# Patient Record
Sex: Male | Born: 1955 | Race: White | Hispanic: No | Marital: Single | Smoking: Former smoker
Health system: Southern US, Community
[De-identification: ages and names within clinical notes are randomized; demographics above are authoritative.]

## PROBLEM LIST (undated history)

## (undated) DIAGNOSIS — R011 Cardiac murmur, unspecified: Secondary | ICD-10-CM

## (undated) DIAGNOSIS — M549 Dorsalgia, unspecified: Secondary | ICD-10-CM

## (undated) HISTORY — PX: MULTIPLE TOOTH EXTRACTIONS: SHX2053

---

## 1998-01-11 ENCOUNTER — Emergency Department (HOSPITAL_COMMUNITY): Admission: EM | Admit: 1998-01-11 | Discharge: 1998-01-12 | Payer: Self-pay | Admitting: Emergency Medicine

## 1998-01-12 ENCOUNTER — Ambulatory Visit (HOSPITAL_COMMUNITY): Admission: RE | Admit: 1998-01-12 | Discharge: 1998-01-12 | Payer: Self-pay | Admitting: Emergency Medicine

## 1998-01-16 ENCOUNTER — Ambulatory Visit (HOSPITAL_COMMUNITY): Admission: RE | Admit: 1998-01-16 | Discharge: 1998-01-16 | Payer: Self-pay | Admitting: Urology

## 1998-02-10 ENCOUNTER — Ambulatory Visit (HOSPITAL_COMMUNITY): Admission: RE | Admit: 1998-02-10 | Discharge: 1998-02-10 | Payer: Self-pay | Admitting: Urology

## 1999-10-30 ENCOUNTER — Emergency Department (HOSPITAL_COMMUNITY): Admission: EM | Admit: 1999-10-30 | Discharge: 1999-10-30 | Payer: Self-pay | Admitting: Emergency Medicine

## 2002-07-01 ENCOUNTER — Emergency Department (HOSPITAL_COMMUNITY): Admission: EM | Admit: 2002-07-01 | Discharge: 2002-07-01 | Payer: Self-pay | Admitting: *Deleted

## 2002-07-01 ENCOUNTER — Encounter: Payer: Self-pay | Admitting: Emergency Medicine

## 2006-05-14 ENCOUNTER — Emergency Department: Payer: Self-pay | Admitting: Emergency Medicine

## 2006-08-16 ENCOUNTER — Encounter: Payer: Self-pay | Admitting: Family Medicine

## 2006-08-24 ENCOUNTER — Ambulatory Visit: Payer: Self-pay

## 2006-09-07 ENCOUNTER — Ambulatory Visit: Payer: Self-pay

## 2006-09-10 ENCOUNTER — Encounter: Payer: Self-pay | Admitting: Family Medicine

## 2007-10-19 ENCOUNTER — Other Ambulatory Visit: Payer: Self-pay

## 2007-10-20 ENCOUNTER — Inpatient Hospital Stay: Payer: Self-pay | Admitting: Internal Medicine

## 2009-01-20 ENCOUNTER — Ambulatory Visit: Payer: Self-pay | Admitting: Family Medicine

## 2010-08-10 ENCOUNTER — Ambulatory Visit: Payer: Self-pay | Admitting: Family Medicine

## 2011-04-19 ENCOUNTER — Ambulatory Visit: Payer: Self-pay | Admitting: Family Medicine

## 2011-06-01 ENCOUNTER — Emergency Department: Payer: Self-pay | Admitting: Emergency Medicine

## 2011-06-03 ENCOUNTER — Emergency Department: Payer: Self-pay | Admitting: Emergency Medicine

## 2011-06-09 ENCOUNTER — Emergency Department: Payer: Self-pay | Admitting: Emergency Medicine

## 2014-03-02 ENCOUNTER — Observation Stay (HOSPITAL_COMMUNITY)
Admission: EM | Admit: 2014-03-02 | Discharge: 2014-03-03 | Disposition: A | Discharge status: Home / Self Care | Payer: Self-pay | Attending: General Surgery | Admitting: General Surgery

## 2014-03-02 ENCOUNTER — Encounter (HOSPITAL_COMMUNITY): Payer: Self-pay | Admitting: Emergency Medicine

## 2014-03-02 ENCOUNTER — Emergency Department (HOSPITAL_COMMUNITY): Payer: Self-pay

## 2014-03-02 DIAGNOSIS — S069X0A Unspecified intracranial injury without loss of consciousness, initial encounter: Principal | ICD-10-CM | POA: Insufficient documentation

## 2014-03-02 DIAGNOSIS — S0990XA Unspecified injury of head, initial encounter: Secondary | ICD-10-CM

## 2014-03-02 DIAGNOSIS — S2220XA Unspecified fracture of sternum, initial encounter for closed fracture: Secondary | ICD-10-CM

## 2014-03-02 DIAGNOSIS — S2232XA Fracture of one rib, left side, initial encounter for closed fracture: Secondary | ICD-10-CM

## 2014-03-02 DIAGNOSIS — S065X9A Traumatic subdural hemorrhage with loss of consciousness of unspecified duration, initial encounter: Secondary | ICD-10-CM

## 2014-03-02 DIAGNOSIS — R1032 Left lower quadrant pain: Secondary | ICD-10-CM | POA: Insufficient documentation

## 2014-03-02 DIAGNOSIS — R918 Other nonspecific abnormal finding of lung field: Secondary | ICD-10-CM | POA: Insufficient documentation

## 2014-03-02 DIAGNOSIS — J438 Other emphysema: Secondary | ICD-10-CM | POA: Insufficient documentation

## 2014-03-02 DIAGNOSIS — IMO0002 Reserved for concepts with insufficient information to code with codable children: Secondary | ICD-10-CM | POA: Insufficient documentation

## 2014-03-02 DIAGNOSIS — S2239XA Fracture of one rib, unspecified side, initial encounter for closed fracture: Secondary | ICD-10-CM | POA: Insufficient documentation

## 2014-03-02 DIAGNOSIS — S065XAA Traumatic subdural hemorrhage with loss of consciousness status unknown, initial encounter: Secondary | ICD-10-CM

## 2014-03-02 DIAGNOSIS — S80811A Abrasion, right lower leg, initial encounter: Secondary | ICD-10-CM | POA: Diagnosis present

## 2014-03-02 DIAGNOSIS — M5030 Other cervical disc degeneration, unspecified cervical region: Secondary | ICD-10-CM | POA: Insufficient documentation

## 2014-03-02 DIAGNOSIS — Y9389 Activity, other specified: Secondary | ICD-10-CM | POA: Insufficient documentation

## 2014-03-02 DIAGNOSIS — R072 Precordial pain: Secondary | ICD-10-CM | POA: Insufficient documentation

## 2014-03-02 DIAGNOSIS — Y998 Other external cause status: Secondary | ICD-10-CM | POA: Insufficient documentation

## 2014-03-02 DIAGNOSIS — Y92410 Unspecified street and highway as the place of occurrence of the external cause: Secondary | ICD-10-CM | POA: Insufficient documentation

## 2014-03-02 DIAGNOSIS — S27329A Contusion of lung, unspecified, initial encounter: Secondary | ICD-10-CM

## 2014-03-02 HISTORY — DX: Dorsalgia, unspecified: M54.9

## 2014-03-02 HISTORY — DX: Cardiac murmur, unspecified: R01.1

## 2014-03-02 LAB — CBC
HCT: 43.1 % (ref 39.0–52.0)
HEMOGLOBIN: 14.4 g/dL (ref 13.0–17.0)
MCH: 31.2 pg (ref 26.0–34.0)
MCHC: 33.4 g/dL (ref 30.0–36.0)
MCV: 93.3 fL (ref 78.0–100.0)
Platelets: 201 10*3/uL (ref 150–400)
RBC: 4.62 MIL/uL (ref 4.22–5.81)
RDW: 13 % (ref 11.5–15.5)
WBC: 9.9 10*3/uL (ref 4.0–10.5)

## 2014-03-02 LAB — COMPREHENSIVE METABOLIC PANEL
ALBUMIN: 4 g/dL (ref 3.5–5.2)
ALT: 38 U/L (ref 0–53)
ANION GAP: 13 (ref 5–15)
AST: 31 U/L (ref 0–37)
Alkaline Phosphatase: 86 U/L (ref 39–117)
BUN: 11 mg/dL (ref 6–23)
CALCIUM: 9.5 mg/dL (ref 8.4–10.5)
CHLORIDE: 102 meq/L (ref 96–112)
CO2: 25 mEq/L (ref 19–32)
CREATININE: 0.73 mg/dL (ref 0.50–1.35)
GFR calc Af Amer: >90 mL/min (ref >90)
GFR calc non Af Amer: >90 mL/min (ref >90)
Glucose, Bld: 90 mg/dL (ref 70–99)
Potassium: 4.7 mEq/L (ref 3.7–5.3)
Sodium: 140 mEq/L (ref 137–147)
TOTAL PROTEIN: 7.7 g/dL (ref 6.0–8.3)
Total Bilirubin: 0.6 mg/dL (ref 0.3–1.2)

## 2014-03-02 LAB — I-STAT TROPONIN, ED: Troponin i, poc: 0 ng/mL (ref 0.00–0.08)

## 2014-03-02 LAB — CDS SEROLOGY

## 2014-03-02 LAB — PROTIME-INR
INR: 1 (ref 0.00–1.49)
PROTHROMBIN TIME: 13.2 s (ref 11.6–15.2)

## 2014-03-02 LAB — SAMPLE TO BLOOD BANK

## 2014-03-02 LAB — ETHANOL: Alcohol, Ethyl (B): <11 mg/dL (ref 0–11)

## 2014-03-02 MED ORDER — FENTANYL CITRATE 0.05 MG/ML IJ SOLN
100.0000 ug | Freq: Once | INTRAMUSCULAR | Status: AC
  Administered 2014-03-02: 100 ug via INTRAVENOUS
  Filled 2014-03-02: qty 2

## 2014-03-02 MED ORDER — SODIUM CHLORIDE 0.9 % IV BOLUS (SEPSIS)
1000.0000 mL | Freq: Once | INTRAVENOUS | Status: AC
  Administered 2014-03-02: 1000 mL via INTRAVENOUS

## 2014-03-02 MED ORDER — IOHEXOL 300 MG/ML  SOLN
100.0000 mL | Freq: Once | INTRAMUSCULAR | Status: AC | PRN
  Administered 2014-03-02: 100 mL via INTRAVENOUS

## 2014-03-02 MED ORDER — ACETAMINOPHEN 325 MG PO TABS: 650.0000 mg | ORAL_TABLET | ORAL | Status: DC | PRN

## 2014-03-02 MED ORDER — POTASSIUM CHLORIDE IN NACL 20-0.9 MEQ/L-% IV SOLN
INTRAVENOUS | Status: DC
  Administered 2014-03-02: 23:00:00 via INTRAVENOUS
  Filled 2014-03-02 (×2): qty 1000

## 2014-03-02 MED ORDER — OXYCODONE HCL 5 MG PO TABS
10.0000 mg | ORAL_TABLET | ORAL | Status: DC | PRN
  Administered 2014-03-03 (×2): 10 mg via ORAL
  Filled 2014-03-02 (×2): qty 2

## 2014-03-02 MED ORDER — ONDANSETRON HCL 4 MG/2ML IJ SOLN: 4.0000 mg | Freq: Four times a day (QID) | INTRAMUSCULAR | Status: DC | PRN

## 2014-03-02 MED ORDER — ONDANSETRON HCL 4 MG/2ML IJ SOLN
4.0000 mg | Freq: Once | INTRAMUSCULAR | Status: AC
  Administered 2014-03-02: 4 mg via INTRAVENOUS
  Filled 2014-03-02: qty 2

## 2014-03-02 MED ORDER — PANTOPRAZOLE SODIUM 40 MG PO TBEC
40.0000 mg | DELAYED_RELEASE_TABLET | Freq: Every day | ORAL | Status: DC
  Administered 2014-03-02 – 2014-03-03 (×2): 40 mg via ORAL
  Filled 2014-03-02 (×3): qty 1

## 2014-03-02 MED ORDER — PANTOPRAZOLE SODIUM 40 MG IV SOLR
40.0000 mg | Freq: Every day | INTRAVENOUS | Status: DC
  Filled 2014-03-02 (×2): qty 40

## 2014-03-02 MED ORDER — METHOCARBAMOL 1000 MG/10ML IJ SOLN: 500.0000 mg | Freq: Three times a day (TID) | INTRAVENOUS | Status: DC | PRN

## 2014-03-02 MED ORDER — OXYCODONE HCL 5 MG PO TABS
5.0000 mg | ORAL_TABLET | ORAL | Status: DC | PRN
  Administered 2014-03-02 (×2): 5 mg via ORAL
  Filled 2014-03-02 (×2): qty 1

## 2014-03-02 MED ORDER — DOCUSATE SODIUM 100 MG PO CAPS
100.0000 mg | ORAL_CAPSULE | Freq: Two times a day (BID) | ORAL | Status: DC
  Administered 2014-03-03: 100 mg via ORAL
  Filled 2014-03-02 (×2): qty 1

## 2014-03-02 MED ORDER — MORPHINE SULFATE 2 MG/ML IJ SOLN
1.0000 mg | INTRAMUSCULAR | Status: DC | PRN
  Administered 2014-03-02 (×2): 4 mg via INTRAVENOUS
  Administered 2014-03-03: 2 mg via INTRAVENOUS
  Administered 2014-03-03 (×3): 4 mg via INTRAVENOUS
  Filled 2014-03-02 (×4): qty 2
  Filled 2014-03-02: qty 1
  Filled 2014-03-02: qty 2

## 2014-03-02 MED ORDER — ONDANSETRON HCL 4 MG PO TABS: 4.0000 mg | ORAL_TABLET | Freq: Four times a day (QID) | ORAL | Status: DC | PRN
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
## 2014-03-02 NOTE — ED Notes (Signed)
Attempted report 

## 2014-03-03 ENCOUNTER — Observation Stay (HOSPITAL_COMMUNITY): Payer: Self-pay

## 2014-03-03 DIAGNOSIS — S2220XA Unspecified fracture of sternum, initial encounter for closed fracture: Secondary | ICD-10-CM | POA: Diagnosis present

## 2014-03-03 DIAGNOSIS — S27329A Contusion of lung, unspecified, initial encounter: Secondary | ICD-10-CM | POA: Diagnosis present

## 2014-03-03 DIAGNOSIS — S80811A Abrasion, right lower leg, initial encounter: Secondary | ICD-10-CM | POA: Diagnosis present

## 2014-03-03 DIAGNOSIS — S2232XA Fracture of one rib, left side, initial encounter for closed fracture: Secondary | ICD-10-CM | POA: Diagnosis present

## 2014-03-03 DIAGNOSIS — S0990XA Unspecified injury of head, initial encounter: Secondary | ICD-10-CM | POA: Diagnosis present

## 2014-03-03 LAB — CBC
HCT: 41.9 % (ref 39.0–52.0)
Hemoglobin: 13.6 g/dL (ref 13.0–17.0)
MCH: 30.6 pg (ref 26.0–34.0)
MCHC: 32.5 g/dL (ref 30.0–36.0)
MCV: 94.2 fL (ref 78.0–100.0)
PLATELETS: 211 10*3/uL (ref 150–400)
RBC: 4.45 MIL/uL (ref 4.22–5.81)
RDW: 13 % (ref 11.5–15.5)
WBC: 8.4 10*3/uL (ref 4.0–10.5)

## 2014-03-03 LAB — BASIC METABOLIC PANEL
ANION GAP: 10 (ref 5–15)
BUN: 8 mg/dL (ref 6–23)
CHLORIDE: 101 meq/L (ref 96–112)
CO2: 27 mEq/L (ref 19–32)
Calcium: 8.9 mg/dL (ref 8.4–10.5)
Creatinine, Ser: 0.89 mg/dL (ref 0.50–1.35)
GFR calc Af Amer: >90 mL/min (ref >90)
Glucose, Bld: 87 mg/dL (ref 70–99)
POTASSIUM: 4.2 meq/L (ref 3.7–5.3)
SODIUM: 138 meq/L (ref 137–147)

## 2014-03-03 LAB — MRSA PCR SCREENING: MRSA by PCR: NEGATIVE
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
## 2014-03-03 NOTE — Progress Notes (Signed)
Patient transferred from ER via stretcher on tele by ER RN. Patient ambulated independently from stretcher to bed, steady gait. Patient oriented to unit and room, instructed on callbell and placed at side. No family at bedside. Will continue to monitor.

## 2014-03-03 NOTE — Progress Notes (Signed)
Discharge instructions provided to patient.  Pt vu without coaching.  Prescription given for percocet.  When to call md, heart healthy diet, and increase activity slowly.  Discharge home via girlfriend. Cheryln Manly

## 2014-03-03 NOTE — Progress Notes (Signed)
Utilization Review Completed.   Kaiea Esselman, RN, BSN Nurse Case Manager  
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
## 2014-03-08 ENCOUNTER — Telehealth (HOSPITAL_COMMUNITY): Payer: Self-pay

## 2014-03-08 MED ORDER — OXYCODONE-ACETAMINOPHEN 10-325 MG PO TABS: 0.5000 | ORAL_TABLET | ORAL | Status: AC | PRN

## 2014-03-08 NOTE — Telephone Encounter (Signed)
Daniel Serrano called requesting refill on Percocet. Despite not being documented in d/c summary I see note where nurse gave rx so must have been afterthought or something. He went home with 5/325 #50. I will rx 10/325 #20 so that he can stretch it out a bit further.

## 2019-09-13 DIAGNOSIS — E782 Mixed hyperlipidemia: Secondary | ICD-10-CM | POA: Diagnosis not present

## 2019-09-13 DIAGNOSIS — Z87891 Personal history of nicotine dependence: Secondary | ICD-10-CM | POA: Diagnosis not present

## 2019-09-13 DIAGNOSIS — G4700 Insomnia, unspecified: Secondary | ICD-10-CM | POA: Diagnosis not present

## 2019-09-13 DIAGNOSIS — M5136 Other intervertebral disc degeneration, lumbar region: Secondary | ICD-10-CM | POA: Diagnosis not present

## 2019-09-13 DIAGNOSIS — M545 Low back pain: Secondary | ICD-10-CM | POA: Diagnosis not present

## 2019-10-04 ENCOUNTER — Encounter (INDEPENDENT_AMBULATORY_CARE_PROVIDER_SITE_OTHER): Payer: Self-pay | Admitting: Gastroenterology

## 2019-10-04 DIAGNOSIS — G4700 Insomnia, unspecified: Secondary | ICD-10-CM | POA: Diagnosis not present

## 2019-10-04 DIAGNOSIS — M545 Low back pain: Secondary | ICD-10-CM | POA: Diagnosis not present

## 2019-10-04 DIAGNOSIS — Z87891 Personal history of nicotine dependence: Secondary | ICD-10-CM | POA: Diagnosis not present

## 2019-12-21 DIAGNOSIS — M5136 Other intervertebral disc degeneration, lumbar region: Secondary | ICD-10-CM | POA: Diagnosis not present

## 2019-12-21 DIAGNOSIS — R7989 Other specified abnormal findings of blood chemistry: Secondary | ICD-10-CM | POA: Diagnosis not present

## 2019-12-21 DIAGNOSIS — G4700 Insomnia, unspecified: Secondary | ICD-10-CM | POA: Diagnosis not present

## 2019-12-21 DIAGNOSIS — Z87891 Personal history of nicotine dependence: Secondary | ICD-10-CM | POA: Diagnosis not present

## 2019-12-21 DIAGNOSIS — M545 Low back pain: Secondary | ICD-10-CM | POA: Diagnosis not present

## 2019-12-27 DIAGNOSIS — Z122 Encounter for screening for malignant neoplasm of respiratory organs: Secondary | ICD-10-CM | POA: Diagnosis not present

## 2019-12-27 DIAGNOSIS — Z87891 Personal history of nicotine dependence: Secondary | ICD-10-CM | POA: Diagnosis not present

## 2020-01-16 ENCOUNTER — Ambulatory Visit (INDEPENDENT_AMBULATORY_CARE_PROVIDER_SITE_OTHER): Payer: Medicare Other | Admitting: Gastroenterology

## 2020-01-16 ENCOUNTER — Other Ambulatory Visit: Payer: Self-pay

## 2020-01-16 ENCOUNTER — Encounter (INDEPENDENT_AMBULATORY_CARE_PROVIDER_SITE_OTHER): Payer: Self-pay | Admitting: Gastroenterology

## 2020-01-16 VITALS — BP 154/95 | HR 89 | Temp 98.5°F | Ht 73.0 in | Wt 197.7 lb

## 2020-01-16 DIAGNOSIS — Z1212 Encounter for screening for malignant neoplasm of rectum: Secondary | ICD-10-CM | POA: Diagnosis not present

## 2020-01-16 DIAGNOSIS — B182 Chronic viral hepatitis C: Secondary | ICD-10-CM

## 2020-01-16 DIAGNOSIS — Z1211 Encounter for screening for malignant neoplasm of colon: Secondary | ICD-10-CM

## 2020-01-16 DIAGNOSIS — B1920 Unspecified viral hepatitis C without hepatic coma: Secondary | ICD-10-CM | POA: Insufficient documentation

## 2020-01-16 NOTE — Patient Instructions (Signed)
Perform blood tests before starting treatment for hepatitis C Perform Cologuard testing

## 2020-01-16 NOTE — Progress Notes (Signed)
[name], M.D. Gastroenterology & Hepatology [facility] [address] [place], [place] [address]  [place] [address]  Referring MD: Judd Lien, MD  I will communicate my assessment and recommendations to the referring MD via EMR. "Note: Occasional unusual wording and randomly placed punctuation marks may result from the use of speech recognition technology to transcribe this document"  Chief Complaint: Hepatitis C  History of Present Illness: Daniel Serrano is a 64 y.o. male who presents for evaluation of hepatitis C.  Patient states that he recently underwent blood work-up as part of his physical examination and was found to have hepatitis C, for which she was referred to our clinic for further management.  The patient brings blood work-up from 09/13/2019 which showed hepatitis C RNA of 4,510,000 copies, genotype 2B, negative testing for HIV, CMP with creatinine 1.01, sodium 143, albumin 4.4, total bilirubin 0.4, alkaline phosphatase 98, AST 46, ALT 91, cell count 9.1, hemoglobin 14.8, platelets 259, TSH 1.8, acute hepatitis panel positive for hepatitis C virus antibody, negative hepatitis B surface antigen, negative hepatitis B core antibody IgM and negative hepatitis A antibody IgM.  Patient states he has been asymptomatic he feels fine.  The patient denies having any nausea, vomiting, fever, chills, hematochezia, melena, hematemesis, abdominal distention, abdominal pain, diarrhea, jaundice, pruritus or weight changes.  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 Patient had a couple tattoos performed by himself, did not share tattoos with anyone else. Never had transfusions before.  Last Colonoscopy: never but he is not interested in pursuing this procedure at this moment.  FHx: neg for any gastrointestinal/liver disease, no malignancies Social: quit smoking 10 years ago,  neg regular alcohol. Former drug use as above.  Past Medical History: Past Medical History:  Diagnosis Date  . Back pain   . Back pain   . Heart murmur     Past Surgical History: Past Surgical History:  Procedure Laterality Date  . MULTIPLE TOOTH EXTRACTIONS Bilateral     Family History: Family History  Problem Relation Age of Onset  . Rheum arthritis Mother   . Heart disease Mother   . Hypertension Mother     Social History: Social History   Tobacco Use  Smoking Status Former Smoker  . Years: 30.00  . Types: Cigarettes  . Quit date: 07/12/2010  . Years since quitting: 9.5  Smokeless Tobacco Never Used   Social History   Substance and Sexual Activity  Alcohol Use Yes   Comment: occassionally  once every other night 1/2 pint gin   Social History   Substance and Sexual Activity  Drug Use No    Allergies: No Known Allergies  Medications: Current Outpatient Medications  Medication Sig Dispense Refill  . loratadine (CLARITIN) 10 MG tablet Take 10 mg by mouth daily. As needed    . naproxen sodium (ANAPROX) 220 MG tablet Take 220 mg by mouth daily as needed (for pain).    Marland Kitchen acetaminophen (TYLENOL) 325 MG tablet Take 650 mg by mouth every 6 (six) hours as needed for moderate pain. (Patient not taking: Reported on 01/16/2020)     No current facility-administered medications for this visit.    Review of Systems: GENERAL: negative for malaise, significant weight loss, night sweats and fever HEENT: No changes in hearing or vision, no nose bleeds or other nasal problems. No trouble swallowing NECK: Negative for lumps, goiter, pain and  significant neck swelling RESPIRATORY: Negative for cough, wheezing and shortness of breath CARDIOVASCULAR: Negative for chest pain, leg swelling, palpitations, orthopnea GI: SEE HPI MUSCULOSKELETAL: Negative for joint pain or swelling, back pain, and muscle pain. SKIN: Negative for lesions, rash, and itching. PSYCH: Negative for  sleep disturbance, mood disorder and recent psychosocial stressors. HEMATOLOGY Negative for prolonged bleeding, bruising easily, and swollen nodes. ENDOCRINE: Negative for cold or heat intolerance, polyuria, polydipsia and goiter. NEURO: negative for lightheadedness, dizziness, tremor, gait imbalance, syncope and seizures. The remainder of the review of systems is noncontributory.   Physical Exam: BP (!) 154/95 (BP Location: Right Arm, Patient Position: Sitting, Cuff Size: Normal)   Pulse 89   Temp 98.5 F (36.9 C) (Oral)   Ht 6\' 1"  (1.854 m)   Wt 197 lb 11.2 oz (89.7 kg)   BMI 26.08 kg/m  GENERAL: The patient is AO x3, in no acute distress. HEENT: Head is normocephalic and atraumatic. EOMI are intact. Mouth is well hydrated and without lesions. NECK: Supple. No masses LUNGS: Clear to auscultation. No presence of rhonchi/wheezing/rales. Adequate chest expansion HEART: RRR, normal s1 and s2. ABDOMEN: Soft, nontender, no guarding, no peritoneal signs, and nondistended. BS +. No masses. EXTREMITIES: Without any cyanosis, clubbing, rash, lesions or edema. NEUROLOGIC: AOx3, no focal motor deficit. SKIN: no jaundice, no rashes   Imaging/Labs: as above  I personally reviewed and interpreted the available labs, imaging and endoscopic files.  Impression and Plan: Daniel Serrano is a 64 y.o. male who presents for evaluation of hepatitis C. the patient is a healthy person otherwise but was found to have hepatitis C by screening testing.  He had a risk factor in the past given the fact he used cocaine intranasally.  He has evidence of ongoing inflammation based on most recent aminotransferases but no presence of alteration in his other liver function tests or synthetic function tests that we will concern about cirrhosis, we will need to check for his platelet count nevertheless.  Before starting him on the available medications to treat his hepatitis C (either Epclusa or Mavyret) we will need  to check his hepatitis B core antibody IgG status as a latent infection will warrant a closer follow-up once anti-hepatitis C treatment is started.  Finally, discussion was held with the patient regarding colorectal cancer screening with colonoscopy, which he is not interested to pursue at this moment.  Decision was made with the patient to proceed with Cologuard testing, he understood that if the test came back positive he will need to undergo a colonoscopy which we will consider performing at that time.  He understands that there may be some false positives to this test.  - Check CBC, CMP, Hep B c Ab - Will order Mavyret or Epclusa once lab results are back - Perform Cologuard testing - RTC 3 months  All questions were answered.      Harvel Quale, MD

## 2020-02-01 LAB — COMPREHENSIVE METABOLIC PANEL
AG Ratio: 1.1 (calc) (ref 1.0–2.5)
ALT: 54 U/L — ABNORMAL HIGH (ref 9–46)
AST: 35 U/L (ref 10–35)
Albumin: 4.6 g/dL (ref 3.6–5.1)
Alkaline phosphatase (APISO): 96 U/L (ref 35–144)
BUN: 21 mg/dL (ref 7–25)
CO2: 29 mmol/L (ref 20–32)
Calcium: 9.8 mg/dL (ref 8.6–10.3)
Chloride: 102 mmol/L (ref 98–110)
Creat: 0.91 mg/dL (ref 0.70–1.25)
Globulin: 4.1 g/dL (calc) — ABNORMAL HIGH (ref 1.9–3.7)
Glucose, Bld: 86 mg/dL (ref 65–139)
Potassium: 4.4 mmol/L (ref 3.5–5.3)
Sodium: 138 mmol/L (ref 135–146)
Total Bilirubin: 0.5 mg/dL (ref 0.2–1.2)
Total Protein: 8.7 g/dL — ABNORMAL HIGH (ref 6.1–8.1)

## 2020-02-01 LAB — CBC
HCT: 48.1 % (ref 38.5–50.0)
Hemoglobin: 16.8 g/dL (ref 13.2–17.1)
MCH: 30.9 pg (ref 27.0–33.0)
MCHC: 34.9 g/dL (ref 32.0–36.0)
MCV: 88.4 fL (ref 80.0–100.0)
MPV: 9.5 fL (ref 7.5–12.5)
Platelets: 227 10*3/uL (ref 140–400)
RBC: 5.44 10*6/uL (ref 4.20–5.80)
RDW: 13.5 % (ref 11.0–15.0)
WBC: 10.2 10*3/uL (ref 3.8–10.8)

## 2020-02-01 LAB — HEPATITIS B CORE ANTIBODY, TOTAL: Hep B Core Total Ab: NONREACTIVE

## 2020-02-05 ENCOUNTER — Telehealth (INDEPENDENT_AMBULATORY_CARE_PROVIDER_SITE_OTHER): Payer: Self-pay | Admitting: Gastroenterology

## 2020-02-05 NOTE — Telephone Encounter (Signed)
I called today the patient to inform him about the negative serology for hepatitis B infection.  Plan will be to start Epclusa for 12 weeks as the patient is treatment nave.  The patient did not respond my call, I left a detailed voice message to start the medication and to call back for questions, will start the approval process for the medication.  The patient is to come back in 3 months for repeat blood testing after finishing treatment course.  Harvel Quale, MD Gastroenterology and Hepatology [facility]

## 2020-02-07 NOTE — Telephone Encounter (Signed)
A Pa for completion has been sent to [place]. Once we heard from them we will let the patient know.

## 2020-02-11 ENCOUNTER — Telehealth (INDEPENDENT_AMBULATORY_CARE_PROVIDER_SITE_OTHER): Payer: Self-pay | Admitting: Gastroenterology

## 2020-02-11 DIAGNOSIS — B182 Chronic viral hepatitis C: Secondary | ICD-10-CM

## 2020-02-11 NOTE — Telephone Encounter (Signed)
Hi Tammy/Mitzie,  Thanks for arranging this. I placed orders for repeat blood tests in 4 weeks after starting treatment to check medication adherence.  He can follow in clinic in 5-6 weeks.  Thanks,,  [name], MD Gastroenterology and Hepatology [facility]

## 2020-02-11 NOTE — Telephone Encounter (Signed)
Noted  

## 2020-02-11 NOTE — Telephone Encounter (Signed)
Patient called wanted to let you know he uses [facility] for his pharmacy

## 2020-02-11 NOTE — Telephone Encounter (Signed)
I talked with [employer] this morning. They had an incorrect number for the patient. This was corrected. They are going to call the patient to discuss some things with him.  Once this is done [facility] will deliver to our office , tentatively this Wednesday ,02/13/20. When we get the medication we call the patient and he will come here to pick it up.  When would you want him to have lab work after starting the medication? Four weeks after start date and to include : CBC/C , C met , Hep C RNA Quant? Also, a office visit in 4 5- weeks post starting the treatment?

## 2020-02-11 NOTE — Telephone Encounter (Signed)
Thanks.  Tammy, we are working on the [employer] for Raeanne Gathers, could this be sent to this pharmacy once approved? Thanks,  [name], MD Gastroenterology and Hepatology [facility]

## 2020-02-18 ENCOUNTER — Other Ambulatory Visit (INDEPENDENT_AMBULATORY_CARE_PROVIDER_SITE_OTHER): Payer: Self-pay | Admitting: *Deleted

## 2020-02-18 ENCOUNTER — Telehealth (INDEPENDENT_AMBULATORY_CARE_PROVIDER_SITE_OTHER): Payer: Self-pay | Admitting: *Deleted

## 2020-02-18 DIAGNOSIS — B182 Chronic viral hepatitis C: Secondary | ICD-10-CM

## 2020-02-18 NOTE — Telephone Encounter (Signed)
Patient presented to the office this morning to pick up his Epculsa and sign for the Cologard test to be sent to him.  Patient plans to start  His Epculsa today , 02/18/2020. He will take 1 by moutn daily with or with or without food. He will have lab work drawn on 03/17/2020 and a follow up visit 03/31/2020 @ 10:15 am.

## 2020-02-18 NOTE — Telephone Encounter (Signed)
Thanks so much Tammy

## 2020-02-20 LAB — CBC
HCT: 47 % (ref 38.5–50.0)
Hemoglobin: 16 g/dL (ref 13.2–17.1)
MCH: 30.2 pg (ref 27.0–33.0)
MCHC: 34 g/dL (ref 32.0–36.0)
MCV: 88.8 fL (ref 80.0–100.0)
MPV: 9.4 fL (ref 7.5–12.5)
Platelets: 246 10*3/uL (ref 140–400)
RBC: 5.29 10*6/uL (ref 4.20–5.80)
RDW: 13.2 % (ref 11.0–15.0)
WBC: 8.2 10*3/uL (ref 3.8–10.8)

## 2020-02-20 LAB — COMPREHENSIVE METABOLIC PANEL
AG Ratio: 1.2 (calc) (ref 1.0–2.5)
ALT: 48 U/L — ABNORMAL HIGH (ref 9–46)
AST: 33 U/L (ref 10–35)
Albumin: 4.4 g/dL (ref 3.6–5.1)
Alkaline phosphatase (APISO): 87 U/L (ref 35–144)
BUN: 18 mg/dL (ref 7–25)
CO2: 30 mmol/L (ref 20–32)
Calcium: 9.6 mg/dL (ref 8.6–10.3)
Chloride: 104 mmol/L (ref 98–110)
Creat: 0.91 mg/dL (ref 0.70–1.25)
Globulin: 3.6 g/dL (calc) (ref 1.9–3.7)
Glucose, Bld: 113 mg/dL — ABNORMAL HIGH (ref 65–99)
Potassium: 4.5 mmol/L (ref 3.5–5.3)
Sodium: 138 mmol/L (ref 135–146)
Total Bilirubin: 0.5 mg/dL (ref 0.2–1.2)
Total Protein: 8 g/dL (ref 6.1–8.1)

## 2020-02-20 LAB — HEPATITIS C RNA QUANTITATIVE
HCV RNA, PCR, QN (Log): 6.34 log IU/mL — ABNORMAL HIGH
HCV RNA, PCR, QN: 2210000 IU/mL — ABNORMAL HIGH

## 2020-02-25 DIAGNOSIS — Z1212 Encounter for screening for malignant neoplasm of rectum: Secondary | ICD-10-CM | POA: Diagnosis not present

## 2020-02-25 DIAGNOSIS — Z1211 Encounter for screening for malignant neoplasm of colon: Secondary | ICD-10-CM | POA: Diagnosis not present

## 2020-03-03 LAB — COLOGUARD: Cologuard: NEGATIVE

## 2020-03-04 ENCOUNTER — Telehealth (INDEPENDENT_AMBULATORY_CARE_PROVIDER_SITE_OTHER): Payer: Self-pay | Admitting: Gastroenterology

## 2020-03-04 NOTE — Telephone Encounter (Signed)
Call patient inform normal result of negative Cologuard testing, he needs to repeat in 3 years.  The patient will come for follow-up appointment in September and will have his blood drawn at that time.  Also, the patient told me that our office will be receiving the refill for his Epclusa (week 5-8).  [name], MD Gastroenterology and Hepatology [facility]

## 2020-03-04 NOTE — Telephone Encounter (Signed)
Pt has already spoke w/ Dr. Loletha Grayer regarding his lab results.

## 2020-03-12 ENCOUNTER — Other Ambulatory Visit (INDEPENDENT_AMBULATORY_CARE_PROVIDER_SITE_OTHER): Payer: Self-pay | Admitting: *Deleted

## 2020-03-12 DIAGNOSIS — B182 Chronic viral hepatitis C: Secondary | ICD-10-CM

## 2020-03-15 LAB — COMPREHENSIVE METABOLIC PANEL
AG Ratio: 1.2 (calc) (ref 1.0–2.5)
ALT: 35 U/L (ref 9–46)
AST: 28 U/L (ref 10–35)
Albumin: 4.5 g/dL (ref 3.6–5.1)
Alkaline phosphatase (APISO): 85 U/L (ref 35–144)
BUN: 17 mg/dL (ref 7–25)
CO2: 32 mmol/L (ref 20–32)
Calcium: 10.4 mg/dL — ABNORMAL HIGH (ref 8.6–10.3)
Chloride: 102 mmol/L (ref 98–110)
Creat: 1.07 mg/dL (ref 0.70–1.25)
Globulin: 3.7 g/dL (calc) (ref 1.9–3.7)
Glucose, Bld: 74 mg/dL (ref 65–139)
Potassium: 4.5 mmol/L (ref 3.5–5.3)
Sodium: 139 mmol/L (ref 135–146)
Total Bilirubin: 0.4 mg/dL (ref 0.2–1.2)
Total Protein: 8.2 g/dL — ABNORMAL HIGH (ref 6.1–8.1)

## 2020-03-15 LAB — CBC WITH DIFFERENTIAL/PLATELET
Absolute Monocytes: 1274 cells/uL — ABNORMAL HIGH (ref 200–950)
Basophils Absolute: 94 cells/uL (ref 0–200)
Basophils Relative: 0.8 %
Eosinophils Absolute: 389 cells/uL (ref 15–500)
Eosinophils Relative: 3.3 %
HCT: 47.7 % (ref 38.5–50.0)
Hemoglobin: 16.3 g/dL (ref 13.2–17.1)
Lymphs Abs: 4106 cells/uL — ABNORMAL HIGH (ref 850–3900)
MCH: 30.6 pg (ref 27.0–33.0)
MCHC: 34.2 g/dL (ref 32.0–36.0)
MCV: 89.5 fL (ref 80.0–100.0)
MPV: 9.7 fL (ref 7.5–12.5)
Monocytes Relative: 10.8 %
Neutro Abs: 5935 cells/uL (ref 1500–7800)
Neutrophils Relative %: 50.3 %
Platelets: 299 10*3/uL (ref 140–400)
RBC: 5.33 10*6/uL (ref 4.20–5.80)
RDW: 13.6 % (ref 11.0–15.0)
Total Lymphocyte: 34.8 %
WBC: 11.8 10*3/uL — ABNORMAL HIGH (ref 3.8–10.8)

## 2020-03-15 LAB — HEPATITIS C RNA QUANTITATIVE
HCV RNA, PCR, QN (Log): <1.18 log IU/mL
HCV RNA, PCR, QN: <15 IU/mL

## 2020-03-25 ENCOUNTER — Other Ambulatory Visit (INDEPENDENT_AMBULATORY_CARE_PROVIDER_SITE_OTHER): Payer: Self-pay | Admitting: *Deleted

## 2020-03-25 DIAGNOSIS — B182 Chronic viral hepatitis C: Secondary | ICD-10-CM

## 2020-03-25 NOTE — Progress Notes (Signed)
Hi Tammy, That's correct, repeat CBC, CMP and HCV viral load should be performed after he has finished his treatment. Thanks

## 2020-03-31 ENCOUNTER — Ambulatory Visit (INDEPENDENT_AMBULATORY_CARE_PROVIDER_SITE_OTHER): Payer: Medicare Other | Admitting: Gastroenterology

## 2020-04-17 ENCOUNTER — Ambulatory Visit (INDEPENDENT_AMBULATORY_CARE_PROVIDER_SITE_OTHER): Payer: Medicare Other | Admitting: Gastroenterology

## 2020-04-30 ENCOUNTER — Other Ambulatory Visit (INDEPENDENT_AMBULATORY_CARE_PROVIDER_SITE_OTHER): Payer: Self-pay | Admitting: *Deleted

## 2020-04-30 DIAGNOSIS — B182 Chronic viral hepatitis C: Secondary | ICD-10-CM

## 2020-05-15 ENCOUNTER — Encounter (INDEPENDENT_AMBULATORY_CARE_PROVIDER_SITE_OTHER): Payer: Self-pay | Admitting: Gastroenterology

## 2020-05-15 ENCOUNTER — Ambulatory Visit (INDEPENDENT_AMBULATORY_CARE_PROVIDER_SITE_OTHER): Payer: Medicare Other | Admitting: Gastroenterology

## 2020-05-15 ENCOUNTER — Other Ambulatory Visit: Payer: Self-pay

## 2020-05-15 VITALS — BP 142/78 | HR 85 | Temp 98.6°F | Ht 73.0 in | Wt 194.7 lb

## 2020-05-15 DIAGNOSIS — B182 Chronic viral hepatitis C: Secondary | ICD-10-CM | POA: Diagnosis not present

## 2020-05-15 NOTE — Progress Notes (Signed)
Daniel Serrano, M.D. Gastroenterology & Hepatology [facility] [address] [place], [place] [address]  Primary Care Physician: Curlene Labrum, MD [place] [address]  I will communicate my assessment and recommendations to the referring MD via EMR. "Note: Occasional unusual wording and randomly placed punctuation marks may result from the use of speech recognition technology to transcribe this document"  Problems: 1. Chronic hepatitis C genotype 2B  History of Present Illness: Daniel Serrano is a 64 y.o. male with past medical history of hepatitis C, who presents for follow up of his chronic hepatitis infection.  Patient started treatment with Epclusa on 02/13/2020 for treatment of his hepatitis.  He did not have any serologies positive for hepatitis B or HIV.  He finished his antiviral 2 days ago.  States that he took all the pills compliantly.  Did not have any side effects and tolerated the medication adequately.  His most recent CMP on 03/13/2020 showed normal AST of 28 and ALT of 35, total bili 0.4 and alkaline phosphatase of 85.  He had a negative viral load at that time.  The patient feels well and denies any complaint. The patient denies having any nausea, vomiting, fever, chills, hematochezia, melena, hematemesis, abdominal distention, abdominal pain, diarrhea, jaundice, pruritus or weight loss.  Past Medical History: Past Medical History:  Diagnosis Date  . Back pain   . Back pain   . Heart murmur     Past Surgical History: Past Surgical History:  Procedure Laterality Date  . MULTIPLE TOOTH EXTRACTIONS Bilateral     Family History: Family History  Problem Relation Age of Onset  . Rheum arthritis Mother   . Heart disease Mother   . Hypertension Mother     Social History: Social History   Tobacco Use  Smoking Status Former Smoker  . Years: 30.00  . Types: Cigarettes  . Quit date: 07/12/2010  .  Years since quitting: 9.8  Smokeless Tobacco Never Used   Social History   Substance and Sexual Activity  Alcohol Use Yes   Comment: occassionally  once every other night 1/2 pint gin   Social History   Substance and Sexual Activity  Drug Use No    Allergies: No Known Allergies  Medications: Current Outpatient Medications  Medication Sig Dispense Refill  . acetaminophen (TYLENOL) 325 MG tablet Take 650 mg by mouth every 6 (six) hours as needed for moderate pain. (Patient not taking: Reported on 01/16/2020)    . loratadine (CLARITIN) 10 MG tablet Take 10 mg by mouth daily. As needed    . naproxen sodium (ANAPROX) 220 MG tablet Take 220 mg by mouth daily as needed (for pain).     No current facility-administered medications for this visit.    Review of Systems: GENERAL: negative for malaise, night sweats HEENT: No changes in hearing or vision, no nose bleeds or other nasal problems. NECK: Negative for lumps, goiter, pain and significant neck swelling RESPIRATORY: Negative for cough, wheezing CARDIOVASCULAR: Negative for chest pain, leg swelling, palpitations, orthopnea GI: SEE HPI MUSCULOSKELETAL: Negative for joint pain or swelling, back pain, and muscle pain. SKIN: Negative for lesions, rash PSYCH: Negative for sleep disturbance, mood disorder and recent psychosocial stressors. HEMATOLOGY Negative for prolonged bleeding, bruising easily, and swollen nodes. ENDOCRINE: Negative for cold or heat intolerance, polyuria, polydipsia and goiter. NEURO: negative for tremor, gait imbalance, syncope and seizures. The remainder of the review of systems is noncontributory.   Physical  Exam: BP (!) 142/78 (BP Location: Right Arm, Patient Position: Sitting, Cuff Size: Normal)   Pulse 85   Temp 98.6 F (37 C) (Oral)   Ht 6\' 1"  (1.854 m)   Wt 194 lb 11.2 oz (88.3 kg)   BMI 25.69 kg/m  GENERAL: The patient is AO x3, in no acute distress. HEENT: Head is normocephalic and atraumatic.  EOMI are intact. Mouth is well hydrated and without lesions. NECK: Supple. No masses LUNGS: Clear to auscultation. No presence of rhonchi/wheezing/rales. Adequate chest expansion HEART: RRR, normal s1 and s2. ABDOMEN: Soft, nontender, no guarding, no peritoneal signs, and nondistended. BS +. No masses. EXTREMITIES: Without any cyanosis, clubbing, rash, lesions or edema. NEUROLOGIC: AOx3, no focal motor deficit. SKIN: no jaundice, no rashes  Imaging/Labs: as above  I personally reviewed and interpreted the available labs, imaging and endoscopic files.  Impression and Plan: Daniel Serrano is a 64 y.o. male with past medical history of hepatitis C, who presents for follow up of his chronic hepatitis infection.  The patient has tolerated the medication adequately and has finished his course of Epclusa without any side effect.  We will check today a CMP and a viral load, if negative no further action should be pursued and he will be considered cured from hepatitis C.  The patient understood and agreed.  -Check CMP and viral load for hepatitis C virus  All questions were answered.      Harvel Quale, MD Gastroenterology and Hepatology [facility]

## 2020-05-15 NOTE — Patient Instructions (Signed)
Perform blood workup  

## 2020-05-18 LAB — COMPREHENSIVE METABOLIC PANEL
AG Ratio: 1.3 (calc) (ref 1.0–2.5)
ALT: 19 U/L (ref 9–46)
AST: 20 U/L (ref 10–35)
Albumin: 4.4 g/dL (ref 3.6–5.1)
Alkaline phosphatase (APISO): 83 U/L (ref 35–144)
BUN: 20 mg/dL (ref 7–25)
CO2: 27 mmol/L (ref 20–32)
Calcium: 9.8 mg/dL (ref 8.6–10.3)
Chloride: 103 mmol/L (ref 98–110)
Creat: 0.98 mg/dL (ref 0.70–1.25)
Globulin: 3.4 g/dL (calc) (ref 1.9–3.7)
Glucose, Bld: 88 mg/dL (ref 65–99)
Potassium: 4.8 mmol/L (ref 3.5–5.3)
Sodium: 141 mmol/L (ref 135–146)
Total Bilirubin: 0.5 mg/dL (ref 0.2–1.2)
Total Protein: 7.8 g/dL (ref 6.1–8.1)

## 2020-05-18 LAB — HEPATITIS C RNA QUANTITATIVE
HCV RNA, PCR, QN (Log): <1.18 log IU/mL
HCV RNA, PCR, QN: <15 IU/mL

## 2020-06-23 DIAGNOSIS — G4700 Insomnia, unspecified: Secondary | ICD-10-CM | POA: Diagnosis not present

## 2020-06-23 DIAGNOSIS — E782 Mixed hyperlipidemia: Secondary | ICD-10-CM | POA: Diagnosis not present

## 2020-06-23 DIAGNOSIS — Z87891 Personal history of nicotine dependence: Secondary | ICD-10-CM | POA: Diagnosis not present

## 2020-06-23 DIAGNOSIS — M545 Low back pain, unspecified: Secondary | ICD-10-CM | POA: Diagnosis not present

## 2020-06-23 DIAGNOSIS — R7989 Other specified abnormal findings of blood chemistry: Secondary | ICD-10-CM | POA: Diagnosis not present

## 2020-06-23 DIAGNOSIS — I700 Atherosclerosis of aorta: Secondary | ICD-10-CM | POA: Diagnosis not present

## 2020-06-23 DIAGNOSIS — Z1389 Encounter for screening for other disorder: Secondary | ICD-10-CM | POA: Diagnosis not present

## 2020-08-19 DIAGNOSIS — M5416 Radiculopathy, lumbar region: Secondary | ICD-10-CM | POA: Diagnosis not present

## 2020-08-19 DIAGNOSIS — E7849 Other hyperlipidemia: Secondary | ICD-10-CM | POA: Diagnosis not present

## 2020-08-19 DIAGNOSIS — M5136 Other intervertebral disc degeneration, lumbar region: Secondary | ICD-10-CM | POA: Diagnosis not present

## 2020-09-01 DIAGNOSIS — Z743 Need for continuous supervision: Secondary | ICD-10-CM | POA: Diagnosis not present

## 2020-09-01 DIAGNOSIS — R069 Unspecified abnormalities of breathing: Secondary | ICD-10-CM | POA: Diagnosis not present

## 2020-09-01 DIAGNOSIS — I444 Left anterior fascicular block: Secondary | ICD-10-CM | POA: Diagnosis not present

## 2020-09-01 DIAGNOSIS — R059 Cough, unspecified: Secondary | ICD-10-CM | POA: Diagnosis not present

## 2020-09-01 DIAGNOSIS — B9689 Other specified bacterial agents as the cause of diseases classified elsewhere: Secondary | ICD-10-CM | POA: Diagnosis not present

## 2020-09-01 DIAGNOSIS — J209 Acute bronchitis, unspecified: Secondary | ICD-10-CM | POA: Diagnosis not present

## 2020-09-01 DIAGNOSIS — R0602 Shortness of breath: Secondary | ICD-10-CM | POA: Diagnosis not present

## 2020-09-01 DIAGNOSIS — R6889 Other general symptoms and signs: Secondary | ICD-10-CM | POA: Diagnosis not present

## 2020-09-29 DIAGNOSIS — R030 Elevated blood-pressure reading, without diagnosis of hypertension: Secondary | ICD-10-CM | POA: Diagnosis not present

## 2020-09-29 DIAGNOSIS — R0602 Shortness of breath: Secondary | ICD-10-CM | POA: Diagnosis not present

## 2020-09-29 DIAGNOSIS — M5416 Radiculopathy, lumbar region: Secondary | ICD-10-CM | POA: Diagnosis not present

## 2020-11-14 DIAGNOSIS — J449 Chronic obstructive pulmonary disease, unspecified: Secondary | ICD-10-CM | POA: Diagnosis not present

## 2020-11-14 DIAGNOSIS — Z87891 Personal history of nicotine dependence: Secondary | ICD-10-CM | POA: Diagnosis not present

## 2020-11-14 DIAGNOSIS — M5136 Other intervertebral disc degeneration, lumbar region: Secondary | ICD-10-CM | POA: Diagnosis not present

## 2020-11-14 DIAGNOSIS — Z008 Encounter for other general examination: Secondary | ICD-10-CM | POA: Diagnosis not present

## 2020-11-14 DIAGNOSIS — R0602 Shortness of breath: Secondary | ICD-10-CM | POA: Diagnosis not present

## 2020-11-14 DIAGNOSIS — R030 Elevated blood-pressure reading, without diagnosis of hypertension: Secondary | ICD-10-CM | POA: Diagnosis not present

## 2020-12-15 DIAGNOSIS — Z1329 Encounter for screening for other suspected endocrine disorder: Secondary | ICD-10-CM | POA: Diagnosis not present

## 2020-12-15 DIAGNOSIS — E782 Mixed hyperlipidemia: Secondary | ICD-10-CM | POA: Diagnosis not present

## 2020-12-15 DIAGNOSIS — E7849 Other hyperlipidemia: Secondary | ICD-10-CM | POA: Diagnosis not present

## 2020-12-15 DIAGNOSIS — R7989 Other specified abnormal findings of blood chemistry: Secondary | ICD-10-CM | POA: Diagnosis not present

## 2020-12-19 DIAGNOSIS — J439 Emphysema, unspecified: Secondary | ICD-10-CM | POA: Diagnosis not present

## 2020-12-19 DIAGNOSIS — E7849 Other hyperlipidemia: Secondary | ICD-10-CM | POA: Diagnosis not present

## 2020-12-19 DIAGNOSIS — I700 Atherosclerosis of aorta: Secondary | ICD-10-CM | POA: Diagnosis not present

## 2020-12-19 DIAGNOSIS — Z0001 Encounter for general adult medical examination with abnormal findings: Secondary | ICD-10-CM | POA: Diagnosis not present

## 2020-12-19 DIAGNOSIS — Z23 Encounter for immunization: Secondary | ICD-10-CM | POA: Diagnosis not present

## 2020-12-19 DIAGNOSIS — R7989 Other specified abnormal findings of blood chemistry: Secondary | ICD-10-CM | POA: Diagnosis not present

## 2020-12-30 DIAGNOSIS — Z87891 Personal history of nicotine dependence: Secondary | ICD-10-CM | POA: Diagnosis not present

## 2021-02-06 ENCOUNTER — Encounter: Payer: Self-pay | Admitting: Internal Medicine

## 2021-02-06 ENCOUNTER — Other Ambulatory Visit: Payer: Self-pay

## 2021-02-06 ENCOUNTER — Ambulatory Visit (INDEPENDENT_AMBULATORY_CARE_PROVIDER_SITE_OTHER): Payer: Medicare Other | Admitting: Internal Medicine

## 2021-02-06 VITALS — BP 160/90 | HR 88 | Temp 97.8°F | Ht 72.0 in | Wt 203.0 lb

## 2021-02-06 DIAGNOSIS — R0600 Dyspnea, unspecified: Secondary | ICD-10-CM | POA: Diagnosis not present

## 2021-02-06 DIAGNOSIS — R0609 Other forms of dyspnea: Secondary | ICD-10-CM

## 2021-02-06 DIAGNOSIS — R911 Solitary pulmonary nodule: Secondary | ICD-10-CM

## 2021-02-06 DIAGNOSIS — J449 Chronic obstructive pulmonary disease, unspecified: Secondary | ICD-10-CM | POA: Insufficient documentation

## 2021-02-06 NOTE — Assessment & Plan Note (Addendum)
Quit smoking 2012 with cough then that resolved - s/p flare to ER 09/01/20 > started on Breo 100 -  02/06/2021   Walked RA 3 laps @ approx 238ft each @ avg  pace stopped due to end of study c/o sob but sats still 95%   - The proper method of use, as well as anticipated side effects, of a metered-dose inhaler were discussed and demonstrated to the patient using teach back method.    rec ok to try using BREO perfectly regularly for a week and then off to see what difference if any it makes for this pt who only had one aecopd in the 10 y since he smoked, at least according to his hx   >>> proceed with PFTs next available.    Each maintenance medication was reviewed in detail including emphasizing most importantly the difference between maintenance and prns and under what circumstances the prns are to be triggered using an action plan format where appropriate.  Total time for H and P, chart review, counseling, reviewing dpi  device(s) , directly observing portions of ambulatory 02 saturation study/ and generating customized AVS unique to this office visit / same day charting =  45 min

## 2021-02-06 NOTE — Progress Notes (Signed)
Sandford Craze, male    DOB: 1956/03/22,  MRN: [id number]   Brief patient profile:  23 yowm quit smoking 2012 with some cough/ not doe and cough resolved  referred to pulmonary clinic 02/06/2021 by Dr   Dr Pleas Koch for copd eval and abn LDST @ [place] / [place]       History of Present Illness  02/06/2021  Pulmonary/ 1st office eval/Taiya Nutting  Chief Complaint  Patient presents with   Consult    Patient reports that he had a scan 1 month ago and was told that he had a mass to be evaluated.   Dyspnea:  walks to bus one mile s stopping but  09/01/20 went to ER [facility] with attack of sob rx saba and started on Breo seemed to help but not using consistently  Cough: none  Sleep: no resp cc SABA use: none  No obvious day to day or daytime variability or assoc excess/ purulent sputum or mucus plugs or hemoptysis or cp or chest tightness, subjective wheeze or overt sinus or hb symptoms.   Sleeping flat  without nocturnal  or early am exacerbation  of respiratory  c/o's or need for noct saba. Also denies any obvious fluctuation of symptoms with weather or environmental changes or other aggravating or alleviating factors except as outlined above   No unusual exposure hx or h/o childhood pna/ asthma or knowledge of premature birth.  Current Allergies, Complete Past Medical History, Past Surgical History, Family History, and Social History were reviewed in [employer] record.  ROS  The following are not active complaints unless bolded Hoarseness, sore throat, dysphagia, dental problems, itching, sneezing,  nasal congestion or discharge of excess mucus or purulent secretions, ear ache,   fever, chills, sweats, unintended wt loss or wt gain, classically pleuritic or exertional cp,  orthopnea pnd or arm/hand swelling  or leg swelling, presyncope, palpitations, abdominal pain, anorexia, nausea, vomiting, diarrhea  or change in bowel habits or change in bladder habits, change in stools  or change in urine, dysuria, hematuria,  rash, arthralgias, visual complaints, headache, numbness, weakness or ataxia or problems with walking or coordination,  change in mood or  memory.           Past Medical History:  Diagnosis Date   Back pain    Back pain    Heart murmur     Outpatient Medications Prior to Visit  Medication Sig Dispense Refill   loratadine (CLARITIN) 10 MG tablet Take 10 mg by mouth daily. As needed     naproxen sodium (ANAPROX) 220 MG tablet Take 220 mg by mouth daily as needed (for pain).     albuterol (VENTOLIN HFA) 108 (90 Base) MCG/ACT inhaler SMARTSIG:1 Puff(s) Via Inhaler 4 Times Daily PRN     atorvastatin (LIPITOR) 20 MG tablet Take 20 mg by mouth daily.     BREO ELLIPTA 100-25 MCG/INH AEPB 1 puff daily.     azithromycin (ZITHROMAX) 250 MG tablet Take by mouth. (Patient not taking: Reported on 02/06/2021)     benzonatate (TESSALON) 100 MG capsule Take 100 mg by mouth 3 (three) times daily as needed. (Patient not taking: Reported on 02/06/2021)     MODERNA COVID-19 VACCINE 100 MCG/0.5ML injection  (Patient not taking: Reported on 02/06/2021)     predniSONE (DELTASONE) 20 MG tablet Take 60 mg by mouth daily. (Patient not taking: Reported on 02/06/2021)     tiZANidine (ZANAFLEX) 4 MG tablet Take 4 mg by mouth 3 (  three) times daily as needed. (Patient not taking: Reported on 02/06/2021)     No facility-administered medications prior to visit.     Objective:     BP (!) 160/90 (BP Location: Left Arm, Patient Position: Sitting, Cuff Size: Normal)   Pulse 88   Temp 97.8 F (36.6 C) (Oral)   Ht 6' (1.829 m)   Wt 203 lb (92.1 kg)   SpO2 95% Comment: Room air  BMI 27.53 kg/m   SpO2: 95 % (Room air)   HEENT : pt wearing mask not removed for exam due to covid - 19 concerns.    NECK :  without JVD/Nodes/TM/ nl carotid upstrokes bilaterally   LUNGS: no acc muscle use,  Mild barrel  contour chest wall with bilateral  Distant bs s audible wheeze and  without  cough on insp or exp maneuvers  and mild  Hyperresonant  to  percussion bilaterally     CV:  RRR  no s3 or murmur or increase in P2, and no edema   ABD:  soft and nontender with pos end  insp Hoover's  in the supine position. No bruits or organomegaly appreciated, bowel sounds nl  MS:   Nl gait/  ext warm without deformities, calf tenderness, cyanosis or clubbing No obvious joint restrictions   SKIN: warm and dry without lesions    NEURO:  alert, approp, nl sensorium with  no motor or cerebellar deficits apparent.       I personally reviewed  radiology impression as follows:   Chest CT 12/30/20 there is an enlarging nodular area of architectural distortion in the superior segment of the right lower lobe (axial image 172 of series 3), with a volume derived mean diameter of 14.3 mm     Assessment   Solitary pulmonary nodule on lung CT  Chest CT 12/30/20 there is an enlarging nodular area of architectural distortion in the superior segment of the right lower lobe (axial image 172 of series 3), with a volume derived mean diameter of 14.3 mm  > 02/06/2021 rec PET   He is 10 y out on d/c smoking so risk is relatively low but these was nothing to suggest an acute inflammatory process in this area nor ongoing clinical evidence of infection so rec proceed to PET now   Discussed in detail all the  indications, usual  risks and alternatives  relative to the benefits with patient who agrees to proceed with w/u as outlined.        Asthmatic bronchitis , chronic (HCC) Quit smoking 2012 with cough then that resolved - s/p flare to ER 09/01/20 > started on Breo 100 -  02/06/2021   Walked RA 3 laps @ approx 247ft each @ avg  pace stopped due to end of study c/o sob but sats still 95%   - The proper method of use, as well as anticipated side effects, of a metered-dose inhaler were discussed and demonstrated to the patient using teach back method.    rec ok to try using BREO perfectly regularly for  a week and then off to see what difference if any it makes for this pt who only had one aecopd in the 10 y since he smoked, at least according to his hx   >>> proceed with PFTs next available.    Each maintenance medication was reviewed in detail including emphasizing most importantly the difference between maintenance and prns and under what circumstances the prns are to be triggered using an action  plan format where appropriate.  Total time for H and P, chart review, counseling, reviewing dpi  device(s) , directly observing portions of ambulatory 02 saturation study/ and generating customized AVS unique to this office visit / same day charting =  45 min        Christinia Gully, MD 02/06/2021

## 2021-02-06 NOTE — Assessment & Plan Note (Signed)
Chest CT 12/30/20 there is an enlarging nodular area of architectural distortion in the superior segment of the right lower lobe (axial image 172 of series 3), with a volume derived mean diameter of 14.3 mm  > 02/06/2021 rec PET   He is 10 y out on d/c smoking so risk is relatively low but these was nothing to suggest an acute inflammatory process in this area nor ongoing clinical evidence of infection so rec proceed to PET now   Discussed in detail all the  indications, usual  risks and alternatives  relative to the benefits with patient who agrees to proceed with w/u as outlined.

## 2021-02-06 NOTE — Patient Instructions (Addendum)
We will schedule lung function tests next availabe - ok to stop breo before or not depending on whether you think it helps taken on a DAILY basis.  We will walk you today (after Breo taken today)   I will be in touch about scheduling you for a PET scan after I have a chance to review your studies and we will schedule this at [facility]

## 2021-02-12 ENCOUNTER — Other Ambulatory Visit (HOSPITAL_COMMUNITY): Payer: Medicare Other

## 2021-02-19 ENCOUNTER — Encounter (HOSPITAL_COMMUNITY)
Admission: RE | Admit: 2021-02-19 | Discharge: 2021-02-19 | Disposition: A | Discharge status: Home / Self Care | Payer: Medicare Other | Source: Ambulatory Visit | Attending: Internal Medicine | Admitting: Internal Medicine

## 2021-02-19 ENCOUNTER — Other Ambulatory Visit: Payer: Self-pay

## 2021-02-19 DIAGNOSIS — R911 Solitary pulmonary nodule: Secondary | ICD-10-CM | POA: Insufficient documentation

## 2021-02-19 DIAGNOSIS — C3490 Malignant neoplasm of unspecified part of unspecified bronchus or lung: Secondary | ICD-10-CM | POA: Diagnosis not present

## 2021-02-19 MED ORDER — FLUDEOXYGLUCOSE F - 18 (FDG) INJECTION
10.5400 | Freq: Once | INTRAVENOUS | Status: AC | PRN
  Administered 2021-02-19: 10.54 via INTRAVENOUS

## 2021-05-27 ENCOUNTER — Other Ambulatory Visit: Payer: Self-pay | Admitting: Internal Medicine

## 2021-05-27 DIAGNOSIS — R911 Solitary pulmonary nodule: Secondary | ICD-10-CM

## 2021-06-11 DIAGNOSIS — R7989 Other specified abnormal findings of blood chemistry: Secondary | ICD-10-CM | POA: Diagnosis not present

## 2021-06-11 DIAGNOSIS — E7849 Other hyperlipidemia: Secondary | ICD-10-CM | POA: Diagnosis not present

## 2021-06-11 DIAGNOSIS — J439 Emphysema, unspecified: Secondary | ICD-10-CM | POA: Diagnosis not present

## 2021-06-11 DIAGNOSIS — E782 Mixed hyperlipidemia: Secondary | ICD-10-CM | POA: Diagnosis not present

## 2021-06-16 DIAGNOSIS — J439 Emphysema, unspecified: Secondary | ICD-10-CM | POA: Diagnosis not present

## 2021-06-16 DIAGNOSIS — E7849 Other hyperlipidemia: Secondary | ICD-10-CM | POA: Diagnosis not present

## 2021-06-16 DIAGNOSIS — I700 Atherosclerosis of aorta: Secondary | ICD-10-CM | POA: Diagnosis not present

## 2021-06-16 DIAGNOSIS — R7989 Other specified abnormal findings of blood chemistry: Secondary | ICD-10-CM | POA: Diagnosis not present

## 2021-06-16 DIAGNOSIS — L723 Sebaceous cyst: Secondary | ICD-10-CM | POA: Diagnosis not present

## 2021-06-16 DIAGNOSIS — R7309 Other abnormal glucose: Secondary | ICD-10-CM | POA: Diagnosis not present

## 2021-07-07 ENCOUNTER — Other Ambulatory Visit (HOSPITAL_COMMUNITY): Payer: Medicare Other

## 2021-12-21 DIAGNOSIS — R7989 Other specified abnormal findings of blood chemistry: Secondary | ICD-10-CM | POA: Diagnosis not present

## 2021-12-21 DIAGNOSIS — E7849 Other hyperlipidemia: Secondary | ICD-10-CM | POA: Diagnosis not present

## 2021-12-21 DIAGNOSIS — E782 Mixed hyperlipidemia: Secondary | ICD-10-CM | POA: Diagnosis not present

## 2021-12-21 DIAGNOSIS — J439 Emphysema, unspecified: Secondary | ICD-10-CM | POA: Diagnosis not present

## 2021-12-21 DIAGNOSIS — R7309 Other abnormal glucose: Secondary | ICD-10-CM | POA: Diagnosis not present

## 2021-12-30 DIAGNOSIS — Z0001 Encounter for general adult medical examination with abnormal findings: Secondary | ICD-10-CM | POA: Diagnosis not present

## 2022-01-11 DIAGNOSIS — H2513 Age-related nuclear cataract, bilateral: Secondary | ICD-10-CM | POA: Diagnosis not present

## 2022-01-11 DIAGNOSIS — H353131 Nonexudative age-related macular degeneration, bilateral, early dry stage: Secondary | ICD-10-CM | POA: Diagnosis not present

## 2022-03-11 DIAGNOSIS — R7989 Other specified abnormal findings of blood chemistry: Secondary | ICD-10-CM | POA: Diagnosis not present

## 2022-03-11 DIAGNOSIS — I700 Atherosclerosis of aorta: Secondary | ICD-10-CM | POA: Diagnosis not present

## 2022-03-11 DIAGNOSIS — E7849 Other hyperlipidemia: Secondary | ICD-10-CM | POA: Diagnosis not present

## 2022-03-11 DIAGNOSIS — J439 Emphysema, unspecified: Secondary | ICD-10-CM | POA: Diagnosis not present

## 2022-03-11 DIAGNOSIS — M5416 Radiculopathy, lumbar region: Secondary | ICD-10-CM | POA: Diagnosis not present

## 2022-03-11 DIAGNOSIS — Z0001 Encounter for general adult medical examination with abnormal findings: Secondary | ICD-10-CM | POA: Diagnosis not present

## 2022-08-18 DIAGNOSIS — E039 Hypothyroidism, unspecified: Secondary | ICD-10-CM | POA: Diagnosis not present

## 2022-08-18 DIAGNOSIS — E7849 Other hyperlipidemia: Secondary | ICD-10-CM | POA: Diagnosis not present

## 2022-08-18 DIAGNOSIS — R7989 Other specified abnormal findings of blood chemistry: Secondary | ICD-10-CM | POA: Diagnosis not present

## 2022-08-18 DIAGNOSIS — R7309 Other abnormal glucose: Secondary | ICD-10-CM | POA: Diagnosis not present

## 2022-08-25 DIAGNOSIS — E7849 Other hyperlipidemia: Secondary | ICD-10-CM | POA: Diagnosis not present

## 2022-08-25 DIAGNOSIS — J439 Emphysema, unspecified: Secondary | ICD-10-CM | POA: Diagnosis not present

## 2022-08-25 DIAGNOSIS — I700 Atherosclerosis of aorta: Secondary | ICD-10-CM | POA: Diagnosis not present

## 2022-08-25 DIAGNOSIS — R7989 Other specified abnormal findings of blood chemistry: Secondary | ICD-10-CM | POA: Diagnosis not present

## 2022-08-25 DIAGNOSIS — R030 Elevated blood-pressure reading, without diagnosis of hypertension: Secondary | ICD-10-CM | POA: Diagnosis not present

## 2022-08-25 DIAGNOSIS — R911 Solitary pulmonary nodule: Secondary | ICD-10-CM | POA: Diagnosis not present

## 2022-08-25 DIAGNOSIS — M5416 Radiculopathy, lumbar region: Secondary | ICD-10-CM | POA: Diagnosis not present

## 2022-09-07 DIAGNOSIS — Z87891 Personal history of nicotine dependence: Secondary | ICD-10-CM | POA: Diagnosis not present

## 2022-09-07 DIAGNOSIS — F17210 Nicotine dependence, cigarettes, uncomplicated: Secondary | ICD-10-CM | POA: Diagnosis not present

## 2023-02-17 DIAGNOSIS — Z0001 Encounter for general adult medical examination with abnormal findings: Secondary | ICD-10-CM | POA: Diagnosis not present

## 2023-02-17 DIAGNOSIS — Z87891 Personal history of nicotine dependence: Secondary | ICD-10-CM | POA: Diagnosis not present

## 2023-02-17 DIAGNOSIS — E039 Hypothyroidism, unspecified: Secondary | ICD-10-CM | POA: Diagnosis not present

## 2023-02-17 DIAGNOSIS — E7849 Other hyperlipidemia: Secondary | ICD-10-CM | POA: Diagnosis not present

## 2023-02-17 DIAGNOSIS — R7303 Prediabetes: Secondary | ICD-10-CM | POA: Diagnosis not present

## 2023-02-24 DIAGNOSIS — Z0001 Encounter for general adult medical examination with abnormal findings: Secondary | ICD-10-CM | POA: Diagnosis not present

## 2023-02-24 DIAGNOSIS — R7989 Other specified abnormal findings of blood chemistry: Secondary | ICD-10-CM | POA: Diagnosis not present

## 2023-02-24 DIAGNOSIS — Z87891 Personal history of nicotine dependence: Secondary | ICD-10-CM | POA: Diagnosis not present

## 2023-02-24 DIAGNOSIS — L409 Psoriasis, unspecified: Secondary | ICD-10-CM | POA: Diagnosis not present

## 2023-02-24 DIAGNOSIS — R030 Elevated blood-pressure reading, without diagnosis of hypertension: Secondary | ICD-10-CM | POA: Diagnosis not present

## 2023-02-24 DIAGNOSIS — R911 Solitary pulmonary nodule: Secondary | ICD-10-CM | POA: Diagnosis not present

## 2023-02-24 DIAGNOSIS — I700 Atherosclerosis of aorta: Secondary | ICD-10-CM | POA: Diagnosis not present

## 2023-02-24 DIAGNOSIS — Z23 Encounter for immunization: Secondary | ICD-10-CM | POA: Diagnosis not present

## 2023-02-24 DIAGNOSIS — E7849 Other hyperlipidemia: Secondary | ICD-10-CM | POA: Diagnosis not present

## 2023-02-24 DIAGNOSIS — J439 Emphysema, unspecified: Secondary | ICD-10-CM | POA: Diagnosis not present

## 2023-02-24 DIAGNOSIS — M5416 Radiculopathy, lumbar region: Secondary | ICD-10-CM | POA: Diagnosis not present

## 2023-03-01 DIAGNOSIS — Z1211 Encounter for screening for malignant neoplasm of colon: Secondary | ICD-10-CM | POA: Diagnosis not present

## 2023-03-01 DIAGNOSIS — Z1212 Encounter for screening for malignant neoplasm of rectum: Secondary | ICD-10-CM | POA: Diagnosis not present

## 2023-03-08 IMAGING — CT NM PET TUM IMG INITIAL (PI) SKULL BASE T - THIGH
1 of 7 series · 1 of 25 positions shown · non-contrast
Comparison: Lung cancer screening CT 12/30/2020, [HOSPITAL] [place].
03/02/2014 abdominopelvic CT.

CLINICAL DATA: Initial treatment strategy for lung cancer screening
exam demonstrating right lower lobe architectural distortion.

EXAM:
NUCLEAR MEDICINE PET SKULL BASE TO THIGH
TECHNIQUE: mCi F-18 FDG was injected intravenously. Full-ring PET imaging was
performed from the skull base to thigh after the radiotracer. CT
data was obtained and used for attenuation correction and anatomic
localization.
Fasting blood glucose:  mg/dl

[Series 3: ctac · axial · 3.0mm · 0.98mm/px · 1 of 323 slices shown]
[im 323/323  brain]
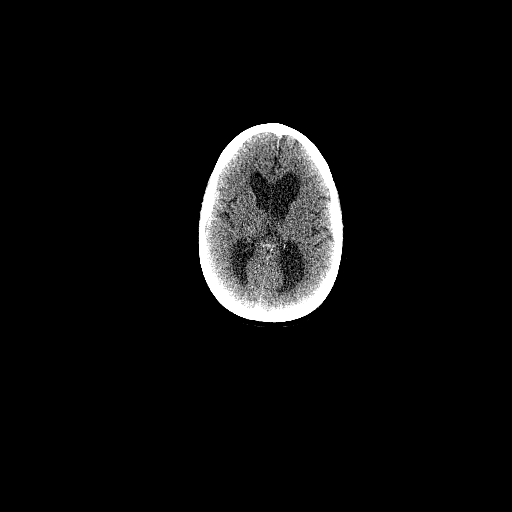

[1 of 25 positions shown; findings below may reference images not displayed]

FINDINGS: Mediastinal blood pool activity: SUV max

Liver activity: SUV max NA

NECK: Left-sided tongue base hypermetabolism posteriorly at a S.U.V.
max of 6.9, without well-defined mass. There is underdistention in
this area including on 49/3.

Incidental CT findings: Prominence of both lateral ventricles,
incompletely imaged. No cervical adenopathy. Multiple small
bilateral cervical nodes.

CHEST: No thoracic nodal hypermetabolism. The area of right lower
lobe soft tissue thickening is without correlate hypermetabolism.

Incidental CT findings: Aortic and coronary artery calcification.
Centrilobular and paraseptal emphysema. Pulmonary nodules are
followed on lung cancer screening CT.

ABDOMEN/PELVIS: No abdominopelvic parenchymal or nodal
hypermetabolism.

Incidental CT findings: Normal adrenal glands. Punctate upper pole
right renal collecting system calculus. Normal noncontrast
appearance of the liver, gallbladder, pancreas. Small abdominal
retroperitoneal nodes, none pathologic by size criteria.

SKELETON: No abnormal marrow activity.

Incidental CT findings: Remote lower left rib fractures. A left
tenth posterior left rib fracture is incompletely healed. Remote
sternal body fracture.
IMPRESSION: 1. The right lower lobe finding of interest is without
hypermetabolic correlate. Not significantly masslike on sagittal
reformats from the prior exam. Favored to represent an area of scar.
Recommend chest CT follow-up at 3 months.
2. Left tongue base hypermetabolism in an area of underdistention.
Consider physical exam correlation.
3. Incidental findings, including: Aortic atherosclerosis
(I6VAO-39O.O), coronary artery atherosclerosis and emphysema
([id number].D). Right nephrolithiasis.
4. Suggestion of ventriculomegaly, incompletely imaged. Correlate
with any symptoms of hydrocephalus and consider dedicated
intracranial imaging.

## 2023-08-25 DIAGNOSIS — Z1329 Encounter for screening for other suspected endocrine disorder: Secondary | ICD-10-CM | POA: Diagnosis not present

## 2023-08-25 DIAGNOSIS — R7989 Other specified abnormal findings of blood chemistry: Secondary | ICD-10-CM | POA: Diagnosis not present

## 2023-08-25 DIAGNOSIS — E7849 Other hyperlipidemia: Secondary | ICD-10-CM | POA: Diagnosis not present

## 2023-08-25 DIAGNOSIS — Z87891 Personal history of nicotine dependence: Secondary | ICD-10-CM | POA: Diagnosis not present

## 2023-09-02 DIAGNOSIS — E030 Congenital hypothyroidism with diffuse goiter: Secondary | ICD-10-CM | POA: Diagnosis not present

## 2023-09-02 DIAGNOSIS — I700 Atherosclerosis of aorta: Secondary | ICD-10-CM | POA: Diagnosis not present

## 2023-09-02 DIAGNOSIS — R911 Solitary pulmonary nodule: Secondary | ICD-10-CM | POA: Diagnosis not present

## 2023-09-02 DIAGNOSIS — Z87891 Personal history of nicotine dependence: Secondary | ICD-10-CM | POA: Diagnosis not present

## 2023-09-02 DIAGNOSIS — E7849 Other hyperlipidemia: Secondary | ICD-10-CM | POA: Diagnosis not present

## 2023-09-02 DIAGNOSIS — R030 Elevated blood-pressure reading, without diagnosis of hypertension: Secondary | ICD-10-CM | POA: Diagnosis not present

## 2024-02-01 DIAGNOSIS — R21 Rash and other nonspecific skin eruption: Secondary | ICD-10-CM | POA: Diagnosis not present

## 2024-02-27 DIAGNOSIS — Z0001 Encounter for general adult medical examination with abnormal findings: Secondary | ICD-10-CM | POA: Diagnosis not present

## 2024-02-27 DIAGNOSIS — R7989 Other specified abnormal findings of blood chemistry: Secondary | ICD-10-CM | POA: Diagnosis not present

## 2024-02-27 DIAGNOSIS — E039 Hypothyroidism, unspecified: Secondary | ICD-10-CM | POA: Diagnosis not present

## 2024-02-27 DIAGNOSIS — Z131 Encounter for screening for diabetes mellitus: Secondary | ICD-10-CM | POA: Diagnosis not present

## 2024-02-27 DIAGNOSIS — E7849 Other hyperlipidemia: Secondary | ICD-10-CM | POA: Diagnosis not present

## 2024-03-05 DIAGNOSIS — I700 Atherosclerosis of aorta: Secondary | ICD-10-CM | POA: Diagnosis not present

## 2024-03-05 DIAGNOSIS — Z0001 Encounter for general adult medical examination with abnormal findings: Secondary | ICD-10-CM | POA: Diagnosis not present

## 2024-03-05 DIAGNOSIS — R21 Rash and other nonspecific skin eruption: Secondary | ICD-10-CM | POA: Diagnosis not present

## 2024-03-05 DIAGNOSIS — Z1389 Encounter for screening for other disorder: Secondary | ICD-10-CM | POA: Diagnosis not present

## 2024-03-05 DIAGNOSIS — E782 Mixed hyperlipidemia: Secondary | ICD-10-CM | POA: Diagnosis not present

## 2024-03-05 DIAGNOSIS — E7849 Other hyperlipidemia: Secondary | ICD-10-CM | POA: Diagnosis not present

## 2024-03-05 DIAGNOSIS — Z87891 Personal history of nicotine dependence: Secondary | ICD-10-CM | POA: Diagnosis not present

## 2024-03-14 DIAGNOSIS — F17210 Nicotine dependence, cigarettes, uncomplicated: Secondary | ICD-10-CM | POA: Diagnosis not present

## 2024-03-14 DIAGNOSIS — Z87891 Personal history of nicotine dependence: Secondary | ICD-10-CM | POA: Diagnosis not present

## 2024-03-14 DIAGNOSIS — Z122 Encounter for screening for malignant neoplasm of respiratory organs: Secondary | ICD-10-CM | POA: Diagnosis not present

## 2024-03-24 DIAGNOSIS — R29703 NIHSS score 3: Secondary | ICD-10-CM | POA: Diagnosis not present

## 2024-03-24 DIAGNOSIS — Z87891 Personal history of nicotine dependence: Secondary | ICD-10-CM | POA: Diagnosis not present

## 2024-03-24 DIAGNOSIS — I639 Cerebral infarction, unspecified: Secondary | ICD-10-CM | POA: Diagnosis not present

## 2024-03-24 DIAGNOSIS — Z5329 Procedure and treatment not carried out because of patient's decision for other reasons: Secondary | ICD-10-CM | POA: Diagnosis not present

## 2024-03-24 DIAGNOSIS — R4781 Slurred speech: Secondary | ICD-10-CM | POA: Diagnosis not present

## 2024-03-24 DIAGNOSIS — I6523 Occlusion and stenosis of bilateral carotid arteries: Secondary | ICD-10-CM | POA: Diagnosis not present
